# Patient Record
Sex: Male | Born: 2000 | Race: White | Hispanic: Yes | Marital: Single | Smoking: Never smoker
Health system: Southern US, Community
[De-identification: ages and names within clinical notes are randomized; demographics above are authoritative.]

## PROBLEM LIST (undated history)

## (undated) DIAGNOSIS — S5290XA Unspecified fracture of unspecified forearm, initial encounter for closed fracture: Secondary | ICD-10-CM

## (undated) HISTORY — PX: TONSILLECTOMY AND ADENOIDECTOMY: SUR1326

## (undated) HISTORY — DX: Unspecified fracture of unspecified forearm, initial encounter for closed fracture: S52.90XA

---
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
## 2020-07-11 IMAGING — CR DG CHEST 1V PORT
1 series · 1 of 1 positions shown · non-contrast
Comparison: None.

CLINICAL DATA: Shortness of breath, sore throat and fatigue for the
past week

EXAM:
PORTABLE CHEST 1 VIEW

[portable]
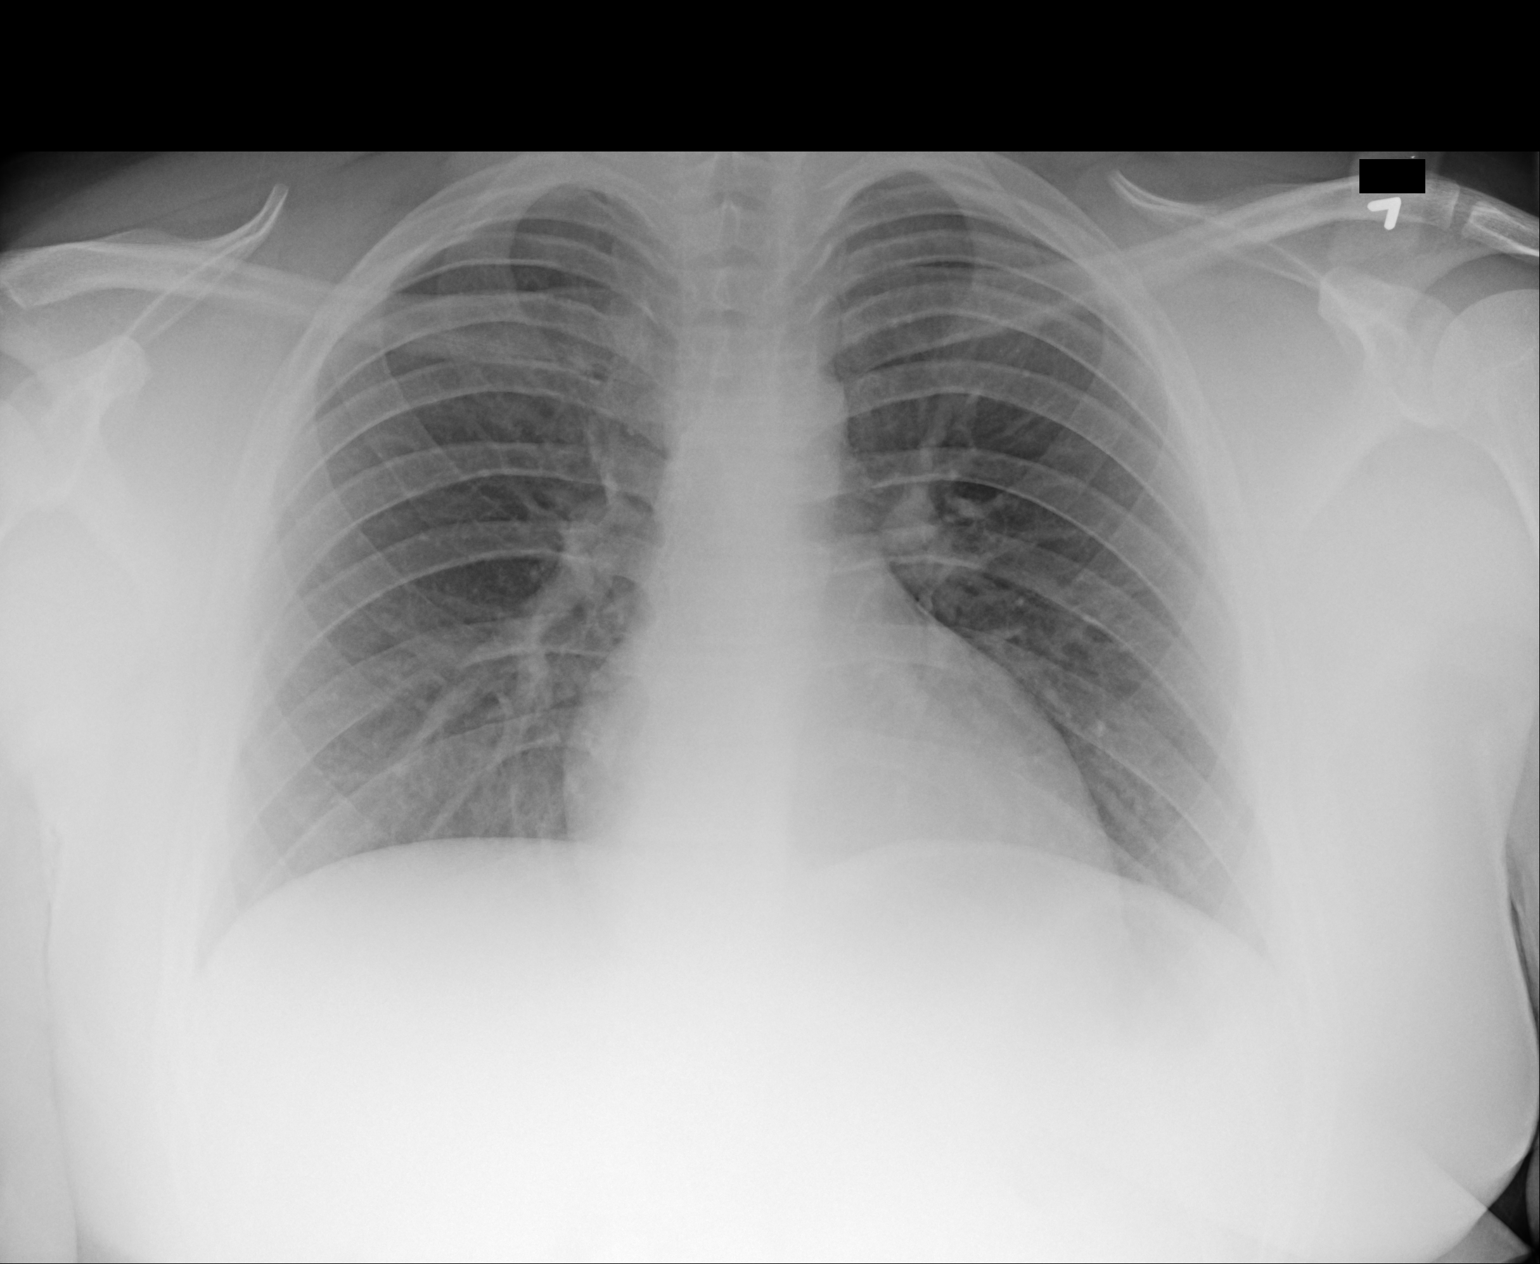

[1 of 1 positions shown; findings below may reference images not displayed]

FINDINGS: Accounting for body habitus, the lungs are clear. No consolidation,
features of edema, pneumothorax, or effusion. Pulmonary vascularity
is normally distributed. The cardiomediastinal contours are
unremarkable. No acute osseous or soft tissue abnormality.
IMPRESSION: No acute cardiopulmonary process.

## 2021-09-22 ENCOUNTER — Encounter: Payer: Self-pay | Admitting: *Deleted
